# Patient Record
Sex: Female | Born: 1935 | Race: White | Hispanic: No
Health system: Southern US, Community
[De-identification: ages and names within clinical notes are randomized; demographics above are authoritative.]

---

## 2003-12-08 ENCOUNTER — Other Ambulatory Visit: Payer: Self-pay

## 2005-03-26 ENCOUNTER — Ambulatory Visit: Payer: Self-pay | Admitting: Cardiology

## 2005-05-17 ENCOUNTER — Ambulatory Visit: Payer: Self-pay

## 2005-05-24 ENCOUNTER — Ambulatory Visit: Payer: Self-pay | Admitting: Ophthalmology

## 2006-09-05 ENCOUNTER — Ambulatory Visit: Payer: Self-pay | Admitting: Ophthalmology

## 2006-09-12 ENCOUNTER — Ambulatory Visit: Payer: Self-pay | Admitting: Ophthalmology

## 2007-08-22 ENCOUNTER — Ambulatory Visit: Payer: Self-pay | Admitting: Family Medicine

## 2008-03-25 ENCOUNTER — Other Ambulatory Visit: Payer: Self-pay

## 2008-03-25 ENCOUNTER — Emergency Department: Payer: Self-pay | Admitting: Emergency Medicine

## 2008-09-13 ENCOUNTER — Ambulatory Visit: Payer: Self-pay | Admitting: Cardiology

## 2008-12-12 ENCOUNTER — Ambulatory Visit: Payer: Self-pay | Admitting: Internal Medicine

## 2008-12-15 ENCOUNTER — Emergency Department: Payer: Self-pay | Admitting: Emergency Medicine

## 2009-01-15 ENCOUNTER — Ambulatory Visit: Payer: Self-pay | Admitting: Internal Medicine

## 2009-02-12 ENCOUNTER — Ambulatory Visit: Payer: Self-pay | Admitting: Vascular Surgery

## 2009-09-18 ENCOUNTER — Emergency Department: Payer: Self-pay | Admitting: Emergency Medicine

## 2009-12-06 IMAGING — XA IR VASCULAR PROCEDURE
7 series · 14 of 14 positions shown · IV contrast (IODINE)
Comparison: none

[Series 1: aortagram · 2 of 2 slices shown (1 of 7)]
[im 1/2]
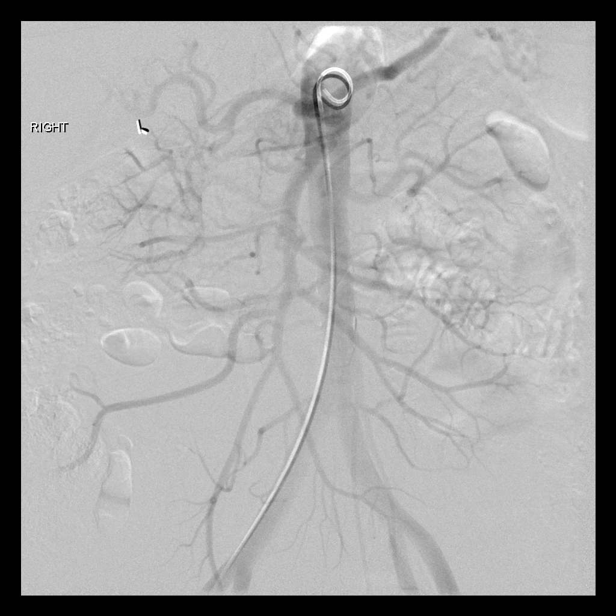
[im 2/2]
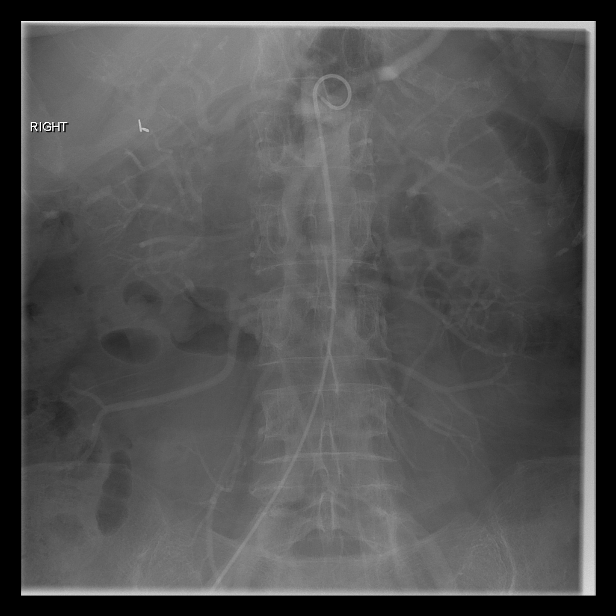

[Series 2: aortagram · 2 of 2 slices shown (2 of 7)]
[im 1/2]
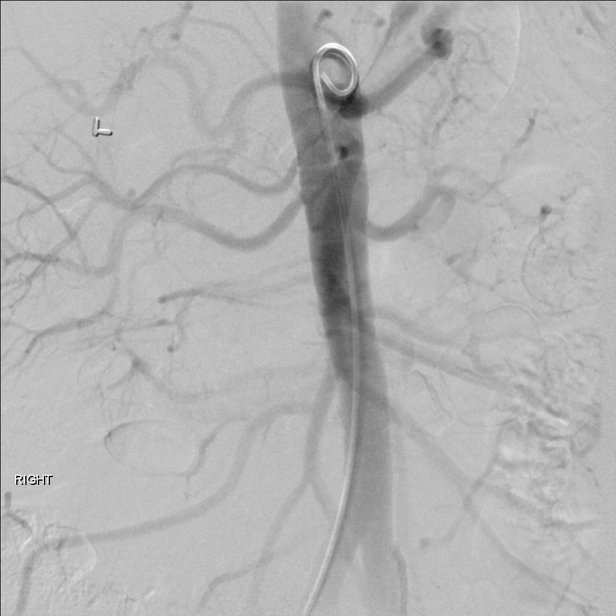
[im 2/2]
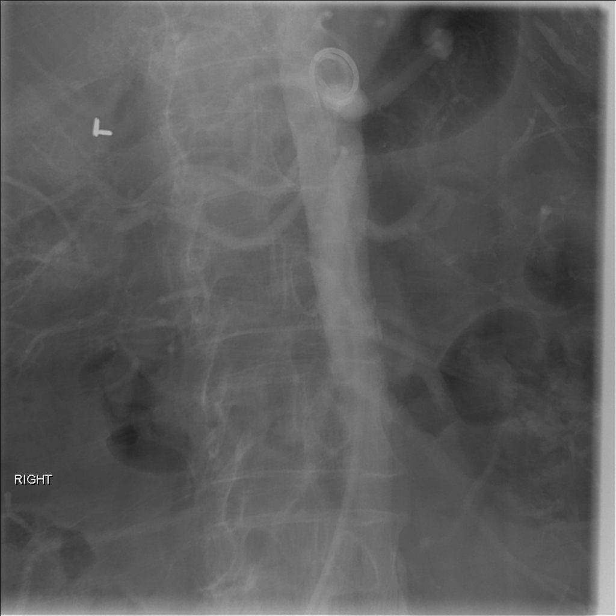

[Series 3: aortagram · 2 of 2 slices shown (3 of 7)]
[im 1/2]
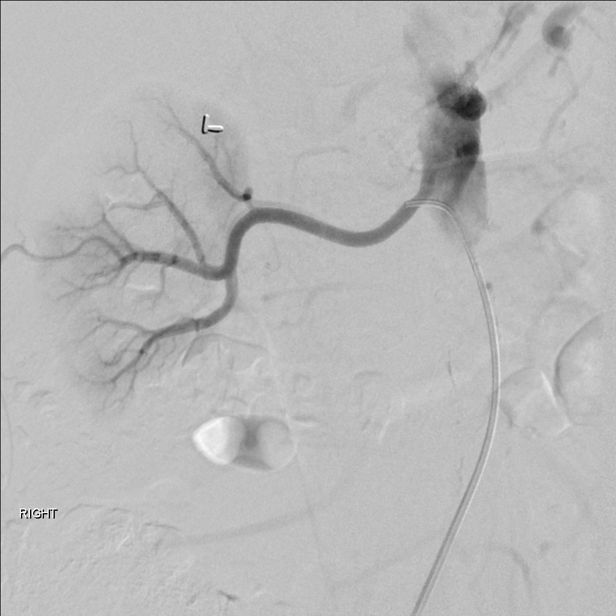
[im 2/2]
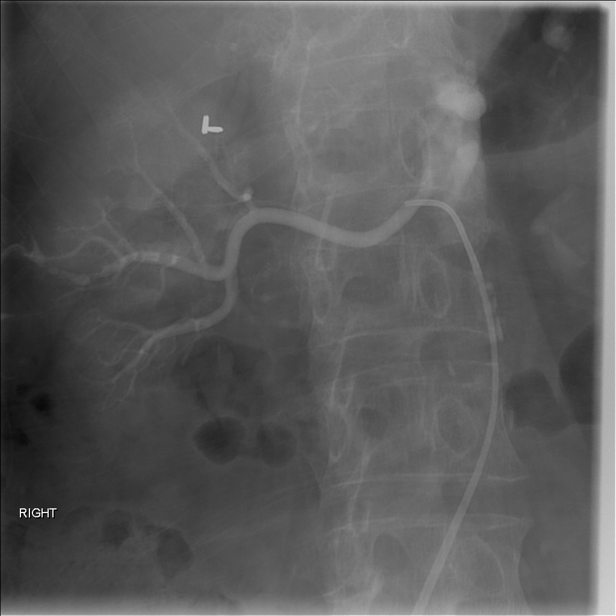

[Series 4: aortagram · 2 of 2 slices shown (4 of 7)]
[im 1/2]
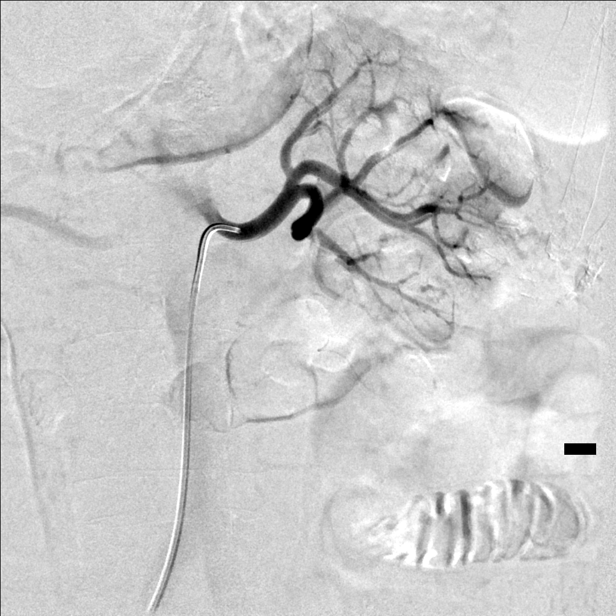
[im 2/2]
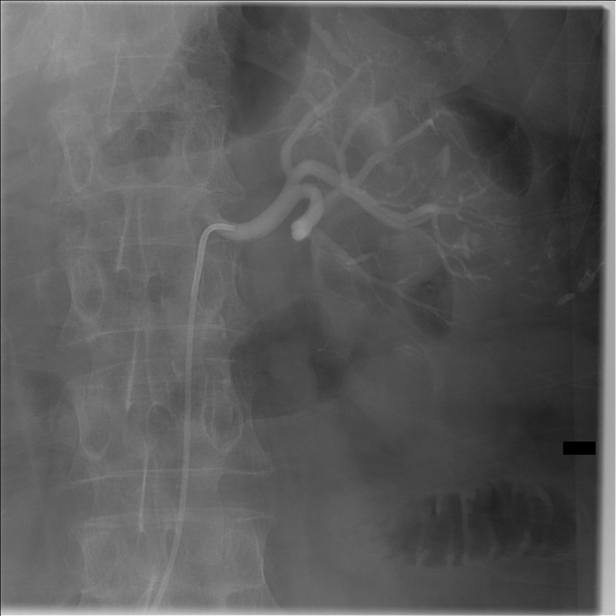

[Series 5: aortagram · 2 of 2 slices shown (5 of 7)]
[im 1/2]
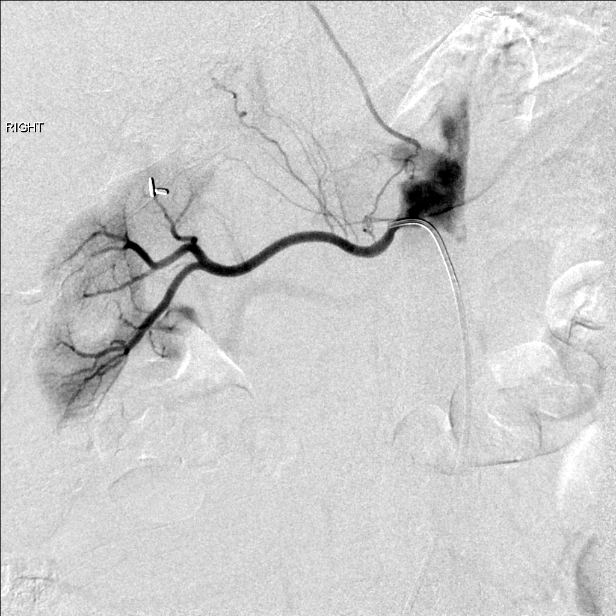
[im 2/2]
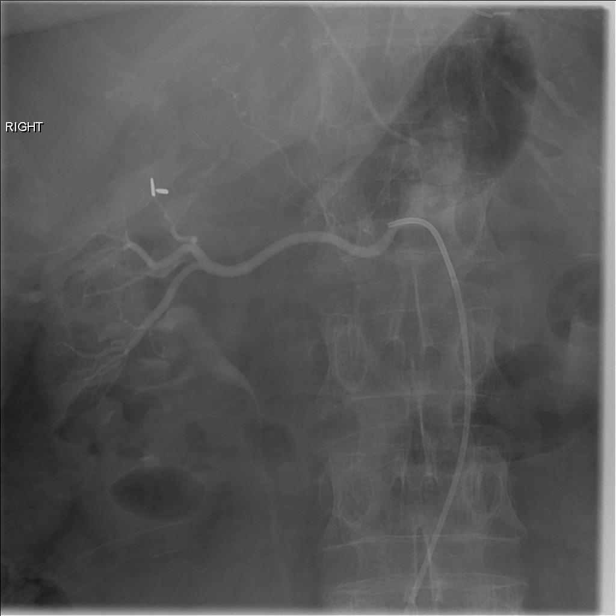

[Series 6: aortagram · 2 of 2 slices shown (6 of 7)]
[im 1/2]
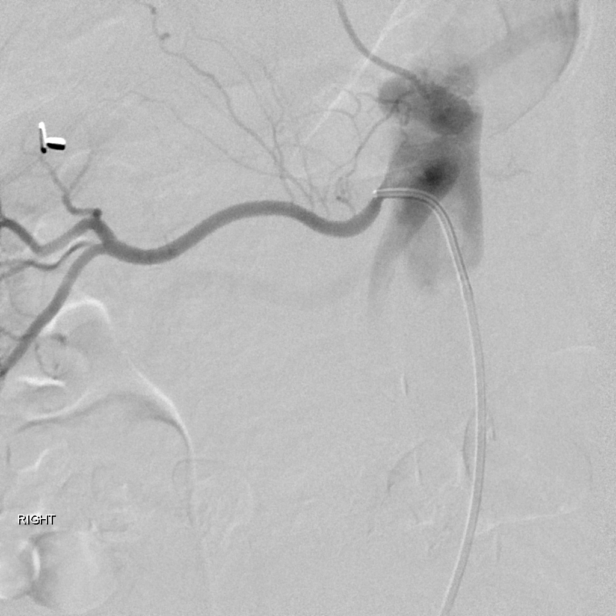
[im 2/2]
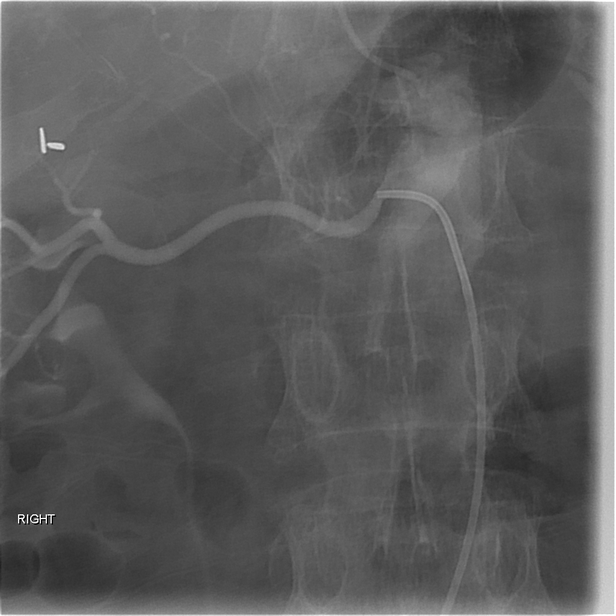

[Series 7: aortagram · 2 of 2 slices shown (7 of 7)]
[im 1/2]
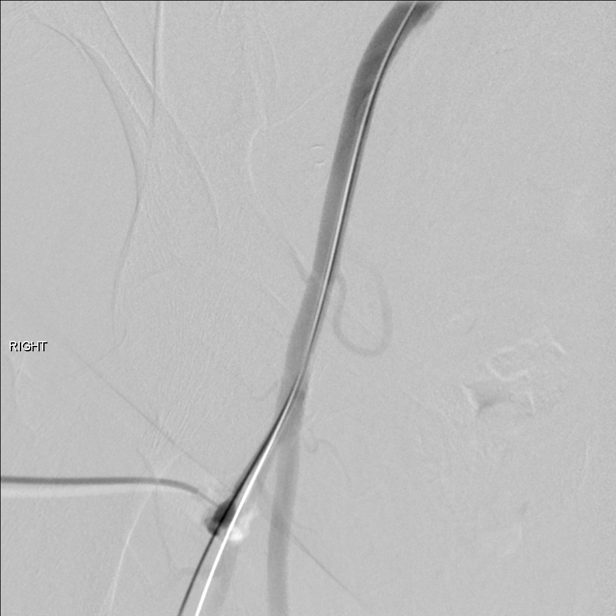
[im 2/2]
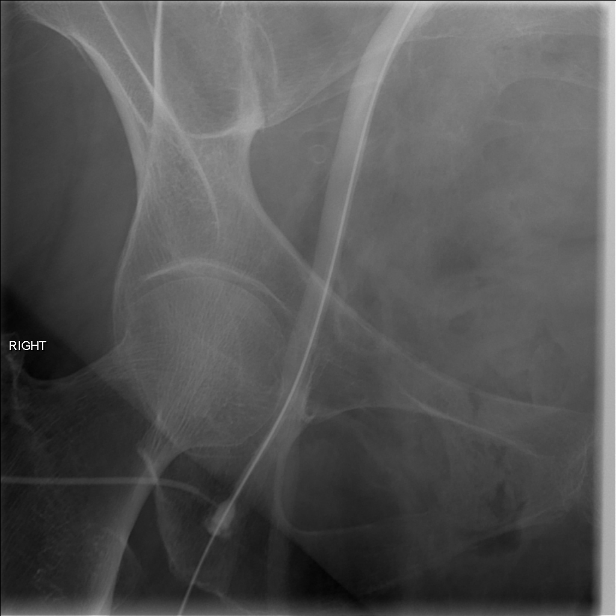

[14 of 14 positions shown; findings below may reference images not displayed]

IMAGES IMPORTED FROM THE [employer] WORKFLOW SYSTEM
NO DICTATION FOR STUDY

## 2010-08-18 ENCOUNTER — Inpatient Hospital Stay: Payer: Self-pay | Admitting: Internal Medicine

## 2011-01-18 ENCOUNTER — Inpatient Hospital Stay: Payer: Self-pay | Admitting: Internal Medicine

## 2011-06-06 ENCOUNTER — Emergency Department: Payer: Self-pay | Admitting: Emergency Medicine

## 2011-06-14 ENCOUNTER — Ambulatory Visit: Payer: Self-pay | Admitting: Cardiology

## 2011-06-17 ENCOUNTER — Ambulatory Visit: Payer: Self-pay | Admitting: Cardiology

## 2012-04-28 ENCOUNTER — Emergency Department: Payer: Self-pay | Admitting: Emergency Medicine

## 2012-04-28 LAB — COMPREHENSIVE METABOLIC PANEL
Albumin: 3.6 g/dL (ref 3.4–5.0)
Alkaline Phosphatase: 86 U/L (ref 50–136)
Anion Gap: 9 (ref 7–16)
BUN: 22 mg/dL — ABNORMAL HIGH (ref 7–18)
Bilirubin,Total: 0.3 mg/dL (ref 0.2–1.0)
Calcium, Total: 9 mg/dL (ref 8.5–10.1)
Chloride: 108 mmol/L — ABNORMAL HIGH (ref 98–107)
Co2: 24 mmol/L (ref 21–32)
Creatinine: 1.37 mg/dL — ABNORMAL HIGH (ref 0.60–1.30)
EGFR (African American): 42 — ABNORMAL LOW
EGFR (Non-African Amer.): 36 — ABNORMAL LOW
Glucose: 132 mg/dL — ABNORMAL HIGH (ref 65–99)
Osmolality: 286 (ref 275–301)
Potassium: 3.7 mmol/L (ref 3.5–5.1)
SGOT(AST): 17 U/L (ref 15–37)
SGPT (ALT): 15 U/L (ref 12–78)
Sodium: 141 mmol/L (ref 136–145)
Total Protein: 7.2 g/dL (ref 6.4–8.2)

## 2012-04-28 LAB — CBC
HCT: 37.5 % (ref 35.0–47.0)
HGB: 13 g/dL (ref 12.0–16.0)
MCH: 32 pg (ref 26.0–34.0)
MCHC: 34.6 g/dL (ref 32.0–36.0)
MCV: 93 fL (ref 80–100)
Platelet: 190 10*3/uL (ref 150–440)
RBC: 4.05 10*6/uL (ref 3.80–5.20)
RDW: 13.1 % (ref 11.5–14.5)
WBC: 6 10*3/uL (ref 3.6–11.0)

## 2012-04-28 LAB — TROPONIN I: Troponin-I: <0.02 ng/mL

## 2013-03-14 ENCOUNTER — Ambulatory Visit: Payer: Self-pay | Admitting: Internal Medicine

## 2013-03-19 ENCOUNTER — Ambulatory Visit: Payer: Self-pay | Admitting: Orthopedic Surgery

## 2013-03-19 DIAGNOSIS — Z01810 Encounter for preprocedural cardiovascular examination: Secondary | ICD-10-CM

## 2013-03-19 LAB — BASIC METABOLIC PANEL
Anion Gap: 6 — ABNORMAL LOW (ref 7–16)
BUN: 15 mg/dL (ref 7–18)
Calcium, Total: 9.5 mg/dL (ref 8.5–10.1)
Chloride: 102 mmol/L (ref 98–107)
Co2: 28 mmol/L (ref 21–32)
Creatinine: 0.82 mg/dL (ref 0.60–1.30)
EGFR (African American): >60
EGFR (Non-African Amer.): >60
Glucose: 118 mg/dL — ABNORMAL HIGH (ref 65–99)
Osmolality: 274 (ref 275–301)
Potassium: 3.9 mmol/L (ref 3.5–5.1)
Sodium: 136 mmol/L (ref 136–145)

## 2013-03-19 LAB — HEMOGLOBIN: HGB: 13.3 g/dL (ref 12.0–16.0)

## 2013-03-19 LAB — HEMATOCRIT: HCT: 40.3 % (ref 35.0–47.0)

## 2013-03-21 ENCOUNTER — Emergency Department: Payer: Self-pay | Admitting: Emergency Medicine

## 2013-03-21 LAB — COMPREHENSIVE METABOLIC PANEL
Albumin: 2.7 g/dL — ABNORMAL LOW (ref 3.4–5.0)
Alkaline Phosphatase: 220 U/L — ABNORMAL HIGH (ref 50–136)
Anion Gap: 7 (ref 7–16)
BUN: 20 mg/dL — ABNORMAL HIGH (ref 7–18)
Bilirubin,Total: 0.5 mg/dL (ref 0.2–1.0)
Calcium, Total: 9.1 mg/dL (ref 8.5–10.1)
Chloride: 102 mmol/L (ref 98–107)
Co2: 29 mmol/L (ref 21–32)
Creatinine: 0.89 mg/dL (ref 0.60–1.30)
EGFR (African American): >60
EGFR (Non-African Amer.): >60
Glucose: 110 mg/dL — ABNORMAL HIGH (ref 65–99)
Osmolality: 279 (ref 275–301)
Potassium: 4.2 mmol/L (ref 3.5–5.1)
SGOT(AST): 57 U/L — ABNORMAL HIGH (ref 15–37)
SGPT (ALT): 66 U/L (ref 12–78)
Sodium: 138 mmol/L (ref 136–145)
Total Protein: 6.2 g/dL — ABNORMAL LOW (ref 6.4–8.2)

## 2013-03-21 LAB — CBC
HCT: 37.1 % (ref 35.0–47.0)
HGB: 12.8 g/dL (ref 12.0–16.0)
MCH: 31.5 pg (ref 26.0–34.0)
MCHC: 34.6 g/dL (ref 32.0–36.0)
MCV: 91 fL (ref 80–100)
Platelet: 284 10*3/uL (ref 150–440)
RBC: 4.08 10*6/uL (ref 3.80–5.20)
RDW: 13.2 % (ref 11.5–14.5)
WBC: 6.1 10*3/uL (ref 3.6–11.0)

## 2013-03-21 LAB — URINALYSIS, COMPLETE
Glucose,UR: NEGATIVE mg/dL (ref 0–75)
Ketone: NEGATIVE
Nitrite: NEGATIVE
Ph: 5 (ref 4.5–8.0)
Protein: 30
RBC,UR: 3 /[HPF] (ref 0–5)
Specific Gravity: 1.026 (ref 1.003–1.030)
Squamous Epithelial: 1
WBC UR: 52 /[HPF] (ref 0–5)

## 2013-03-21 LAB — PROTIME-INR
INR: 2.2
Prothrombin Time: 24.1 s — ABNORMAL HIGH (ref 11.5–14.7)

## 2013-03-21 LAB — TROPONIN I: Troponin-I: <0.02 ng/mL

## 2013-03-23 LAB — URINE CULTURE

## 2013-03-27 ENCOUNTER — Inpatient Hospital Stay: Payer: Self-pay | Admitting: Cardiology

## 2013-03-27 LAB — BASIC METABOLIC PANEL
Anion Gap: 5 — ABNORMAL LOW (ref 7–16)
BUN: 18 mg/dL (ref 7–18)
Calcium, Total: 7.8 mg/dL — ABNORMAL LOW (ref 8.5–10.1)
Chloride: 107 mmol/L (ref 98–107)
Co2: 27 mmol/L (ref 21–32)
Creatinine: 0.85 mg/dL (ref 0.60–1.30)
EGFR (African American): >60
EGFR (Non-African Amer.): >60
Glucose: 115 mg/dL — ABNORMAL HIGH (ref 65–99)
Osmolality: 280 (ref 275–301)
Potassium: 3.4 mmol/L — ABNORMAL LOW (ref 3.5–5.1)
Sodium: 139 mmol/L (ref 136–145)

## 2013-03-27 LAB — CBC WITH DIFFERENTIAL/PLATELET
Basophil #: 0 10*3/uL (ref 0.0–0.1)
Basophil %: 0.7 %
Eosinophil #: 0.3 10*3/uL (ref 0.0–0.7)
Eosinophil %: 5.9 %
HCT: 33.5 % — ABNORMAL LOW (ref 35.0–47.0)
HGB: 11.4 g/dL — ABNORMAL LOW (ref 12.0–16.0)
Lymphocyte %: 10.9 %
Lymphs Abs: 0.6 10*3/uL — ABNORMAL LOW (ref 1.0–3.6)
MCH: 30.9 pg (ref 26.0–34.0)
MCHC: 33.9 g/dL (ref 32.0–36.0)
MCV: 91 fL (ref 80–100)
Monocyte #: 0.4 x10 3/mm (ref 0.2–0.9)
Monocyte %: 7 %
Neutrophil #: 4.2 10*3/uL (ref 1.4–6.5)
Neutrophil %: 75.5 %
Platelet: 244 10*3/uL (ref 150–440)
RBC: 3.68 10*6/uL — ABNORMAL LOW (ref 3.80–5.20)
RDW: 12.9 % (ref 11.5–14.5)
WBC: 5.6 10*3/uL (ref 3.6–11.0)

## 2013-03-27 LAB — PROTIME-INR
INR: 0.9
Prothrombin Time: 12.6 s (ref 11.5–14.7)

## 2013-03-28 LAB — PATHOLOGY REPORT

## 2013-03-29 ENCOUNTER — Inpatient Hospital Stay: Payer: Self-pay | Admitting: Internal Medicine

## 2013-03-29 LAB — CBC
HCT: 33.5 % — ABNORMAL LOW (ref 35.0–47.0)
HGB: 11.4 g/dL — ABNORMAL LOW (ref 12.0–16.0)
MCH: 31 pg (ref 26.0–34.0)
MCHC: 33.9 g/dL (ref 32.0–36.0)
MCV: 91 fL (ref 80–100)
Platelet: 221 10*3/uL (ref 150–440)
RBC: 3.67 10*6/uL — ABNORMAL LOW (ref 3.80–5.20)
RDW: 13.1 % (ref 11.5–14.5)
WBC: 6.3 10*3/uL (ref 3.6–11.0)

## 2013-03-29 LAB — COMPREHENSIVE METABOLIC PANEL
Albumin: 2.2 g/dL — ABNORMAL LOW (ref 3.4–5.0)
Alkaline Phosphatase: 137 U/L — ABNORMAL HIGH (ref 50–136)
Anion Gap: 5 — ABNORMAL LOW (ref 7–16)
BUN: 22 mg/dL — ABNORMAL HIGH (ref 7–18)
Bilirubin,Total: 0.4 mg/dL (ref 0.2–1.0)
Calcium, Total: 8.5 mg/dL (ref 8.5–10.1)
Chloride: 105 mmol/L (ref 98–107)
Co2: 27 mmol/L (ref 21–32)
Creatinine: 0.98 mg/dL (ref 0.60–1.30)
EGFR (African American): >60
EGFR (Non-African Amer.): 54 — ABNORMAL LOW
Glucose: 145 mg/dL — ABNORMAL HIGH (ref 65–99)
Osmolality: 280 (ref 275–301)
Potassium: 4 mmol/L (ref 3.5–5.1)
SGOT(AST): 24 U/L (ref 15–37)
SGPT (ALT): 23 U/L (ref 12–78)
Sodium: 137 mmol/L (ref 136–145)
Total Protein: 5.5 g/dL — ABNORMAL LOW (ref 6.4–8.2)

## 2013-03-29 LAB — CK TOTAL AND CKMB (NOT AT ARMC)
CK, Total: 46 U/L (ref 21–215)
CK-MB: <0.5 ng/mL — ABNORMAL LOW (ref 0.5–3.6)

## 2013-03-29 LAB — PROTIME-INR
INR: 1
Prothrombin Time: 13.3 s (ref 11.5–14.7)

## 2013-03-29 LAB — TROPONIN I: Troponin-I: <0.02 ng/mL

## 2013-03-30 LAB — URINALYSIS, COMPLETE
Bilirubin,UR: NEGATIVE
Blood: NEGATIVE
Glucose,UR: NEGATIVE mg/dL (ref 0–75)
Ketone: NEGATIVE
Nitrite: NEGATIVE
Ph: 6 (ref 4.5–8.0)
Protein: NEGATIVE
RBC,UR: 2 /[HPF] (ref 0–5)
Specific Gravity: 1.005 (ref 1.003–1.030)
Squamous Epithelial: 4
WBC UR: 13 /[HPF] (ref 0–5)

## 2013-03-30 LAB — CBC WITH DIFFERENTIAL/PLATELET
Basophil #: 0 10*3/uL (ref 0.0–0.1)
Basophil %: 0.5 %
Eosinophil #: 0.6 10*3/uL (ref 0.0–0.7)
Eosinophil %: 12.6 %
HCT: 33.4 % — ABNORMAL LOW (ref 35.0–47.0)
HGB: 11.4 g/dL — ABNORMAL LOW (ref 12.0–16.0)
Lymphocyte %: 17.1 %
Lymphs Abs: 0.8 10*3/uL — ABNORMAL LOW (ref 1.0–3.6)
MCH: 31.1 pg (ref 26.0–34.0)
MCHC: 34.3 g/dL (ref 32.0–36.0)
MCV: 91 fL (ref 80–100)
Monocyte #: 0.3 x10 3/mm (ref 0.2–0.9)
Monocyte %: 6.9 %
Neutrophil #: 3.1 10*3/uL (ref 1.4–6.5)
Neutrophil %: 62.9 %
Platelet: 216 10*3/uL (ref 150–440)
RBC: 3.67 10*6/uL — ABNORMAL LOW (ref 3.80–5.20)
RDW: 13.1 % (ref 11.5–14.5)
WBC: 4.9 10*3/uL (ref 3.6–11.0)

## 2013-03-30 LAB — BASIC METABOLIC PANEL
Anion Gap: 8 (ref 7–16)
BUN: 23 mg/dL — ABNORMAL HIGH (ref 7–18)
Calcium, Total: 8.7 mg/dL (ref 8.5–10.1)
Chloride: 103 mmol/L (ref 98–107)
Co2: 26 mmol/L (ref 21–32)
Creatinine: 1 mg/dL (ref 0.60–1.30)
EGFR (African American): >60
EGFR (Non-African Amer.): 52 — ABNORMAL LOW
Glucose: 109 mg/dL — ABNORMAL HIGH (ref 65–99)
Osmolality: 278 (ref 275–301)
Potassium: 3.6 mmol/L (ref 3.5–5.1)
Sodium: 137 mmol/L (ref 136–145)

## 2013-03-30 LAB — PROTIME-INR
INR: 1.1
Prothrombin Time: 14.2 s (ref 11.5–14.7)

## 2013-03-30 LAB — LIPID PANEL
Cholesterol: 123 mg/dL (ref 0–200)
HDL Cholesterol: 40 mg/dL (ref 40–60)
Ldl Cholesterol, Calc: 61 mg/dL (ref 0–100)
Triglycerides: 108 mg/dL (ref 0–200)
VLDL Cholesterol, Calc: 22 mg/dL (ref 5–40)

## 2013-03-30 LAB — TSH: Thyroid Stimulating Horm: 5.57 u[IU]/mL — ABNORMAL HIGH

## 2013-03-31 LAB — CBC WITH DIFFERENTIAL/PLATELET
Basophil #: 0 10*3/uL (ref 0.0–0.1)
Basophil %: 0.7 %
Eosinophil #: 0.6 10*3/uL (ref 0.0–0.7)
Eosinophil %: 10.1 %
HCT: 31.9 % — ABNORMAL LOW (ref 35.0–47.0)
HGB: 11.1 g/dL — ABNORMAL LOW (ref 12.0–16.0)
Lymphocyte %: 13.4 %
Lymphs Abs: 0.8 10*3/uL — ABNORMAL LOW (ref 1.0–3.6)
MCH: 31.5 pg (ref 26.0–34.0)
MCHC: 34.8 g/dL (ref 32.0–36.0)
MCV: 90 fL (ref 80–100)
Monocyte #: 0.5 x10 3/mm (ref 0.2–0.9)
Monocyte %: 7.4 %
Neutrophil #: 4.2 10*3/uL (ref 1.4–6.5)
Neutrophil %: 68.4 %
Platelet: 228 10*3/uL (ref 150–440)
RBC: 3.53 10*6/uL — ABNORMAL LOW (ref 3.80–5.20)
RDW: 13.1 % (ref 11.5–14.5)
WBC: 6.2 10*3/uL (ref 3.6–11.0)

## 2013-03-31 LAB — BASIC METABOLIC PANEL
Anion Gap: 8 (ref 7–16)
BUN: 18 mg/dL (ref 7–18)
Calcium, Total: 7.9 mg/dL — ABNORMAL LOW (ref 8.5–10.1)
Chloride: 108 mmol/L — ABNORMAL HIGH (ref 98–107)
Co2: 24 mmol/L (ref 21–32)
Creatinine: 0.83 mg/dL (ref 0.60–1.30)
EGFR (African American): >60
EGFR (Non-African Amer.): >60
Glucose: 102 mg/dL — ABNORMAL HIGH (ref 65–99)
Osmolality: 281 (ref 275–301)
Potassium: 3.2 mmol/L — ABNORMAL LOW (ref 3.5–5.1)
Sodium: 140 mmol/L (ref 136–145)

## 2013-03-31 LAB — PROTIME-INR
INR: 1.9
Prothrombin Time: 21.3 s — ABNORMAL HIGH (ref 11.5–14.7)

## 2013-04-01 LAB — CBC WITH DIFFERENTIAL/PLATELET
Basophil #: 0 10*3/uL (ref 0.0–0.1)
Basophil %: 0.7 %
Eosinophil #: 0.6 10*3/uL (ref 0.0–0.7)
Eosinophil %: 11.2 %
HCT: 32.7 % — ABNORMAL LOW (ref 35.0–47.0)
HGB: 11.2 g/dL — ABNORMAL LOW (ref 12.0–16.0)
Lymphocyte %: 20.9 %
Lymphs Abs: 1.1 10*3/uL (ref 1.0–3.6)
MCH: 31.1 pg (ref 26.0–34.0)
MCHC: 34.3 g/dL (ref 32.0–36.0)
MCV: 91 fL (ref 80–100)
Monocyte #: 0.4 x10 3/mm (ref 0.2–0.9)
Monocyte %: 7.4 %
Neutrophil #: 3.1 10*3/uL (ref 1.4–6.5)
Neutrophil %: 59.8 %
Platelet: 216 10*3/uL (ref 150–440)
RBC: 3.6 10*6/uL — ABNORMAL LOW (ref 3.80–5.20)
RDW: 13.2 % (ref 11.5–14.5)
WBC: 5.2 10*3/uL (ref 3.6–11.0)

## 2013-04-01 LAB — BASIC METABOLIC PANEL
Anion Gap: 9 (ref 7–16)
BUN: 15 mg/dL (ref 7–18)
Calcium, Total: 8.2 mg/dL — ABNORMAL LOW (ref 8.5–10.1)
Chloride: 106 mmol/L (ref 98–107)
Co2: 25 mmol/L (ref 21–32)
Creatinine: 0.88 mg/dL (ref 0.60–1.30)
EGFR (African American): >60
EGFR (Non-African Amer.): >60
Glucose: 78 mg/dL (ref 65–99)
Osmolality: 279 (ref 275–301)
Potassium: 3.4 mmol/L — ABNORMAL LOW (ref 3.5–5.1)
Sodium: 140 mmol/L (ref 136–145)

## 2013-04-01 LAB — PROTIME-INR
INR: 2.1
Prothrombin Time: 23.2 s — ABNORMAL HIGH (ref 11.5–14.7)

## 2013-04-01 LAB — MAGNESIUM: Magnesium: 1.1 mg/dL — ABNORMAL LOW

## 2013-04-02 LAB — BASIC METABOLIC PANEL
Anion Gap: 6 — ABNORMAL LOW (ref 7–16)
BUN: 11 mg/dL (ref 7–18)
Calcium, Total: 8.1 mg/dL — ABNORMAL LOW (ref 8.5–10.1)
Chloride: 106 mmol/L (ref 98–107)
Co2: 26 mmol/L (ref 21–32)
Creatinine: 0.66 mg/dL (ref 0.60–1.30)
EGFR (African American): >60
EGFR (Non-African Amer.): >60
Glucose: 85 mg/dL (ref 65–99)
Osmolality: 274 (ref 275–301)
Potassium: 3.5 mmol/L (ref 3.5–5.1)
Sodium: 138 mmol/L (ref 136–145)

## 2013-04-02 LAB — CBC WITH DIFFERENTIAL/PLATELET
Basophil #: 0 10*3/uL (ref 0.0–0.1)
Basophil %: 0.7 %
Eosinophil #: 0.5 10*3/uL (ref 0.0–0.7)
Eosinophil %: 10.2 %
HCT: 31.3 % — ABNORMAL LOW (ref 35.0–47.0)
HGB: 10.7 g/dL — ABNORMAL LOW (ref 12.0–16.0)
Lymphocyte %: 18.3 %
Lymphs Abs: 0.9 10*3/uL — ABNORMAL LOW (ref 1.0–3.6)
MCH: 30.9 pg (ref 26.0–34.0)
MCHC: 34.2 g/dL (ref 32.0–36.0)
MCV: 90 fL (ref 80–100)
Monocyte #: 0.4 x10 3/mm (ref 0.2–0.9)
Monocyte %: 7.7 %
Neutrophil #: 3.1 10*3/uL (ref 1.4–6.5)
Neutrophil %: 63.1 %
Platelet: 209 10*3/uL (ref 150–440)
RBC: 3.47 10*6/uL — ABNORMAL LOW (ref 3.80–5.20)
RDW: 13.4 % (ref 11.5–14.5)
WBC: 4.9 10*3/uL (ref 3.6–11.0)

## 2013-04-02 LAB — PROTIME-INR
INR: 2.3
Prothrombin Time: 24.5 s — ABNORMAL HIGH (ref 11.5–14.7)

## 2013-04-02 LAB — MAGNESIUM: Magnesium: 1.8 mg/dL

## 2013-07-22 ENCOUNTER — Ambulatory Visit: Payer: Self-pay | Admitting: Neurology

## 2013-07-22 LAB — CBC WITH DIFFERENTIAL/PLATELET
Basophil #: 0.1 10*3/uL (ref 0.0–0.1)
Basophil %: 0.9 %
Eosinophil #: 0.1 10*3/uL (ref 0.0–0.7)
Eosinophil %: 2.1 %
HCT: 38.2 % (ref 35.0–47.0)
HGB: 12.7 g/dL (ref 12.0–16.0)
Lymphocyte %: 24.8 %
Lymphs Abs: 1.5 10*3/uL (ref 1.0–3.6)
MCH: 29.9 pg (ref 26.0–34.0)
MCHC: 33.3 g/dL (ref 32.0–36.0)
MCV: 90 fL (ref 80–100)
Monocyte #: 0.4 x10 3/mm (ref 0.2–0.9)
Monocyte %: 6.1 %
Neutrophil #: 4 10*3/uL (ref 1.4–6.5)
Neutrophil %: 66.1 %
Platelet: 189 10*3/uL (ref 150–440)
RBC: 4.26 10*6/uL (ref 3.80–5.20)
RDW: 13.5 % (ref 11.5–14.5)
WBC: 6.1 10*3/uL (ref 3.6–11.0)

## 2013-07-22 LAB — URINALYSIS, COMPLETE
Bilirubin,UR: NEGATIVE
Blood: NEGATIVE
Glucose,UR: NEGATIVE mg/dL (ref 0–75)
Ketone: NEGATIVE
Leukocyte Esterase: NEGATIVE
Nitrite: NEGATIVE
Ph: 6 (ref 4.5–8.0)
Protein: 30
RBC,UR: 11 /[HPF] (ref 0–5)
Specific Gravity: 1.025 (ref 1.003–1.030)
Squamous Epithelial: 2
WBC UR: 5 /[HPF] (ref 0–5)

## 2013-07-22 LAB — PROTIME-INR
INR: 1.3
Prothrombin Time: 16.6 s — ABNORMAL HIGH (ref 11.5–14.7)

## 2013-07-22 LAB — COMPREHENSIVE METABOLIC PANEL
Albumin: 3.2 g/dL — ABNORMAL LOW (ref 3.4–5.0)
Alkaline Phosphatase: 68 U/L
Anion Gap: 8 (ref 7–16)
BUN: 16 mg/dL (ref 7–18)
Bilirubin,Total: 0.4 mg/dL (ref 0.2–1.0)
Calcium, Total: 8.8 mg/dL (ref 8.5–10.1)
Chloride: 108 mmol/L — ABNORMAL HIGH (ref 98–107)
Co2: 24 mmol/L (ref 21–32)
Creatinine: 1.06 mg/dL (ref 0.60–1.30)
EGFR (African American): 56 — ABNORMAL LOW
EGFR (Non-African Amer.): 49 — ABNORMAL LOW
Glucose: 131 mg/dL — ABNORMAL HIGH (ref 65–99)
Osmolality: 282 (ref 275–301)
Potassium: 4 mmol/L (ref 3.5–5.1)
SGOT(AST): 23 U/L (ref 15–37)
SGPT (ALT): 12 U/L (ref 12–78)
Sodium: 140 mmol/L (ref 136–145)
Total Protein: 6.4 g/dL (ref 6.4–8.2)

## 2013-07-22 LAB — TROPONIN I: Troponin-I: <0.02 ng/mL

## 2013-07-22 LAB — APTT: Activated PTT: 33.8 s (ref 23.6–35.9)

## 2013-07-23 ENCOUNTER — Inpatient Hospital Stay: Payer: Self-pay | Admitting: Internal Medicine

## 2013-07-23 LAB — LIPID PANEL
Cholesterol: 125 mg/dL (ref 0–200)
HDL Cholesterol: 43 mg/dL (ref 40–60)
Ldl Cholesterol, Calc: 61 mg/dL (ref 0–100)
Triglycerides: 105 mg/dL (ref 0–200)
VLDL Cholesterol, Calc: 21 mg/dL (ref 5–40)

## 2013-07-24 LAB — PROTIME-INR
INR: 1.6
Prothrombin Time: 18.7 s — ABNORMAL HIGH (ref 11.5–14.7)

## 2013-07-25 LAB — PROTIME-INR
INR: 1.6
Prothrombin Time: 18.8 s — ABNORMAL HIGH (ref 11.5–14.7)

## 2013-07-26 LAB — PROTIME-INR
INR: 1.6
Prothrombin Time: 19 s — ABNORMAL HIGH (ref 11.5–14.7)

## 2013-07-26 LAB — BASIC METABOLIC PANEL
Anion Gap: 3 — ABNORMAL LOW (ref 7–16)
BUN: 6 mg/dL — ABNORMAL LOW (ref 7–18)
Calcium, Total: 8.4 mg/dL — ABNORMAL LOW (ref 8.5–10.1)
Chloride: 112 mmol/L — ABNORMAL HIGH (ref 98–107)
Co2: 29 mmol/L (ref 21–32)
Creatinine: 0.99 mg/dL (ref 0.60–1.30)
EGFR (African American): >60
EGFR (Non-African Amer.): 53 — ABNORMAL LOW
Glucose: 91 mg/dL (ref 65–99)
Osmolality: 284 (ref 275–301)
Potassium: 3.5 mmol/L (ref 3.5–5.1)
Sodium: 144 mmol/L (ref 136–145)

## 2013-07-26 LAB — PLATELET COUNT: Platelet: 159 10*3/uL (ref 150–440)

## 2013-07-26 LAB — HEMOGLOBIN: HGB: 11.5 g/dL — ABNORMAL LOW (ref 12.0–16.0)

## 2013-07-27 LAB — PROTIME-INR
INR: 1.7
Prothrombin Time: 20.1 s — ABNORMAL HIGH (ref 11.5–14.7)

## 2013-11-13 ENCOUNTER — Emergency Department: Payer: Self-pay | Admitting: Emergency Medicine

## 2013-11-13 LAB — CBC WITH DIFFERENTIAL/PLATELET
Basophil #: 0 10*3/uL (ref 0.0–0.1)
Basophil %: 0.6 %
Eosinophil #: 0.3 10*3/uL (ref 0.0–0.7)
Eosinophil %: 6.3 %
HCT: 36.4 % (ref 35.0–47.0)
HGB: 11.8 g/dL — ABNORMAL LOW (ref 12.0–16.0)
Lymphocyte %: 18.9 %
Lymphs Abs: 1 10*3/uL (ref 1.0–3.6)
MCH: 29.8 pg (ref 26.0–34.0)
MCHC: 32.5 g/dL (ref 32.0–36.0)
MCV: 92 fL (ref 80–100)
Monocyte #: 0.3 x10 3/mm (ref 0.2–0.9)
Monocyte %: 5.2 %
Neutrophil #: 3.7 10*3/uL (ref 1.4–6.5)
Neutrophil %: 69 %
Platelet: 155 10*3/uL (ref 150–440)
RBC: 3.97 10*6/uL (ref 3.80–5.20)
RDW: 14.5 % (ref 11.5–14.5)
WBC: 5.3 10*3/uL (ref 3.6–11.0)

## 2013-11-13 LAB — COMPREHENSIVE METABOLIC PANEL
Albumin: 3 g/dL — ABNORMAL LOW (ref 3.4–5.0)
Alkaline Phosphatase: 82 U/L
Anion Gap: 4 — ABNORMAL LOW (ref 7–16)
BUN: 24 mg/dL — ABNORMAL HIGH (ref 7–18)
Bilirubin,Total: 0.2 mg/dL (ref 0.2–1.0)
Calcium, Total: 8.6 mg/dL (ref 8.5–10.1)
Chloride: 108 mmol/L — ABNORMAL HIGH (ref 98–107)
Co2: 27 mmol/L (ref 21–32)
Creatinine: 1.1 mg/dL (ref 0.60–1.30)
EGFR (African American): 54 — ABNORMAL LOW
EGFR (Non-African Amer.): 46 — ABNORMAL LOW
Glucose: 117 mg/dL — ABNORMAL HIGH (ref 65–99)
Osmolality: 283 (ref 275–301)
Potassium: 4.2 mmol/L (ref 3.5–5.1)
SGOT(AST): 17 U/L (ref 15–37)
SGPT (ALT): 12 U/L (ref 12–78)
Sodium: 139 mmol/L (ref 136–145)
Total Protein: 6.6 g/dL (ref 6.4–8.2)

## 2013-11-13 LAB — CK: CK, Total: 37 U/L

## 2014-03-08 ENCOUNTER — Emergency Department: Payer: Self-pay | Admitting: Emergency Medicine

## 2014-12-13 NOTE — Discharge Summary (Signed)
PATIENT NAME:  Sheri Hayden, Sheri Hayden MR#:  [id number] DATE OF BIRTH:  October 04, 1927  DATE OF ADMISSION:  03/29/2013 DATE OF DISCHARGE:  04/02/2013  DISCHARGE DIAGNOSES: 1.  Stroke.  2.  Hypotension.  3.  Atrial fibrillation.  4.  Back pain with recent kyphoplasty.   DISCHARGE MEDICATIONS: Per [facility] medication reconciliation done in the [facility] discharge form. Please see that for details.   HISTORY AND PHYSICAL: Please see H and P done on the day of admission for details.   HOSPITAL COURSE: The patient was admitted confused, left-sided weakness and hypotensive after recently having high-dose carvedilol and diltiazem started. She was given fluid resuscitation. Blood pressure came up some. She was off her meds. Her symptoms improved markedly. Strokes were seen in the right frontoparietal region, likely from her recent strokes at [facility]. She was back to her usual status, except for mildly more confused while in the hospital. She will be discharged on low-dose Toprol for rate control. She is fully anticoagulated again on her warfarin. She had been off for the kyphoplasty she had a few days prior to admission. INR was 2.3 this morning. Magnesium was normal at 1.8, had been low earlier in the hospitalization.  Met b was normal as well. Hemoglobin is stable at 10.7 this morning as well. She had a repeat carotid Doppler done with medically treatable carotid stenosis, already being on statin. Echocardiogram was also done, which showed no source of emboli, normal LV function. MRI was not done given her permanent pacemaker. The rest of her workup was unremarkable. Again, the head CT showed low attenuation areas in bilateral frontal and right temporoparietal regions suggestive of late subacute infarcts, which were likely the ones she had at [facility] last month. She will home health ordered. She will be seen in followup soon in my office.    TIME SPENT: It took approximately 35 minutes for all dc tasks today   ____________________________ Ocie Cornfield. Ouida Sills, MD mwa:dmm D: 04/02/2013 08:15:17 ET T: 04/02/2013 10:25:01 ET JOB#: [id number]  cc: Ocie Cornfield. Ouida Sills, MD, <Dictator> Kirk Ruths MD ELECTRONICALLY SIGNED 04/02/2013 18:34

## 2014-12-13 NOTE — Discharge Summary (Signed)
PATIENT NAME:  Sheri Hayden, Sheri Hayden MR#:  [id number] DATE OF BIRTH:  08-May-1928  PRIMARY CARE PHYSICIAN:  Dr. Dareen PianoAnderson.   FINAL DIAGNOSES: 1.  Atrial fibrillation and atrial flutter.  2.  Coronary artery disease.  3.  Hyperlipidemia.  4.  Hypertension.  5.  L1 compression fracture.   DISCHARGE MEDICATIONS: Warfarin 2 mg daily, diltiazem extended release 240 mg daily, carvedilol 12.5 mg b.i.d., simvastatin 10 mg daily, lisinopril 5 mg daily, omeprazole 20 mg every morning, phenytoin 300 mg at bedtime, mirtazapine 0.5 mg at bedtime, tramadol 50 mg q.4 p.r.n., Norco 1325/5; one q.4 hours. p.r.n.   PROCEDURES: L1 biopsy and kyphoplasty.   HISTORY OF PRESENT COURSE:  Please see cardiology consult dictated on 03/27/2013.   HOSPITAL COURSE:  The patient underwent L1 biopsy and kyphoplasty for L1 compression fracture. Following the procedure, the patient developed atrial fibrillation with a rapid ventricular rate. In the recovery room, the patient received diltiazem 50 mg intravenous bolus, followed by a 10 mg bolus, but remained in atrial fibrillation and atrial flutter with a rate of 130. The patient was admitted to telemetry where she remained clinically and hemodynamically stable. She was restarted on her home medications which included Cardizem CD 240 mg daily and carvedilol 12.5 mg b.i.d., which had been recently discontinued. The patient converted to sinus rhythm and was in normal sinus rhythm on the morning of 03/28/2013. The patient is discharged home in stable condition. She is scheduled to see Dr. Lady GaryFath in followup in 1 to 2 weeks.    ____________________________ Marcina MillardAlexander Bentzion Dauria, MD ap:NTS D: 03/28/2013 07:03:30 ET T: 03/28/2013 07:51:24 ET JOB#: [id number]  cc: Marcina MillardAlexander Tristian Bouska, MD, <Dictator> Marcina MillardALEXANDER Ivon Roedel MD ELECTRONICALLY SIGNED 04/18/2013 15:59

## 2014-12-13 NOTE — H&P (Signed)
PATIENT NAME:  Sheri Hayden, Sheri Hayden DATE OF BIRTH:  1928/03/06  DATE OF ADMISSION:  03/27/2013  PRIMARY CARE PHYSICIAN: Marya AmslerMarshall W. Dareen PianoAnderson, MD  CHIEF COMPLAINT: "I had surgery."   HISTORY OF PRESENT ILLNESS: The patient is an 81 year old female with known history of coronary artery disease status post bypass graft surgery. The patient has had a history of paroxysmal atrial fibrillation, followed by Dr. Lady GaryFath. The patient underwent kyphoplasty today. Following the procedure, the patient developed atrial fibrillation with a rapid ventricular rate of 150 BPM. The patient was asymptomatic, denied chest pain or palpitations. The patient was treated with Cardizem bolus 15 mg, followed by 10 mg and was restarted on Cardizem CD 240 mg daily. The patient was on Cardizem CD 240 mg and carvedilol 12.5 mg b.i.d. as an outpatient, which was discontinued recently. The patient has remained in atrial fibrillation/flutter with a rapid rate of 130 BPM. She is now admitted to observation to restart her atrial fibrillation medications.   PAST MEDICAL HISTORY:  1. Status post coronary artery bypass graft surgery 02/23/1996 with LIMA to LAD, saphenous vein bypass graft to right PDA and saphenous vein bypass graft to D1.  2. Paroxysmal atrial fibrillation.  3. Hyperlipidemia.  4. Hypertension.  5. Seizure disorder.  6. Dementia.   MEDICATIONS:  1. Warfarin 4 mg daily.  2. Lisinopril 5 mg daily. 3. Mirtazapine 7.5 mg at bedtime.  4. Omeprazole 20 mg q.a.m.  5. Phenytoin 300 mg at bedtime.  6. Simvastatin 10 mg at bedtime.  7. Tramadol 50 mg q.4 p.r.n.   SOCIAL HISTORY: The patient is married. She is retired. She has no history of tobacco abuse.   FAMILY HISTORY: Notable for 3 brothers all status post bypass graft surgery before the age of 79.   REVIEW OF SYSTEMS:  CONSTITUTIONAL: No fever or chills.  EYES: No blurry vision.  EARS: No hearing loss.  RESPIRATORY: No shortness of breath.   CARDIOVASCULAR: No chest pain.  GASTROINTESTINAL: No nausea, vomiting or diarrhea.  GENITOURINARY: No dysuria or hematuria.  ENDOCRINE: No polyuria or polydipsia.  MUSCULOSKELETAL: The patient just underwent kyphoplasty.  NEUROLOGICAL: No focal muscle weakness or numbness.  PSYCHOLOGICAL: No depression or anxiety.   PHYSICAL EXAMINATION:  VITAL SIGNS: Blood pressure was 90/60, pulse was 130 and regular.  HEENT: Pupils equally reactive to light and accommodation.  NECK: Supple without thyromegaly.  LUNGS: Clear.  CARDIOVASCULAR: With normal JVP. Normal PMI. Tachycardia. Normal S1, S2. No appreciable gallop, murmur or rub.  ABDOMEN: Soft and nontender.  PULSES: Intact bilaterally.  MUSCULOSKELETAL: Normal muscle tone.  NEUROLOGICAL: The patient is alert and oriented x3. Motor and sensory both grossly intact.   IMPRESSION: An 81 year old female who developed atrial fibrillation following kyphoplasty, likely triggered by recent discontinuation of both Cardizem and carvedilol.   RECOMMENDATIONS:  1. Admit to observation. 2. Restart Cardizem CD 240 mg daily.  3. Restart carvedilol 12.5 mg b.i.d.  4. Will continue to hold warfarin for now.   ____________________________ Marcina MillardAlexander Trevante Tennell, MD ap:OSi D: 03/27/2013 13:26:00 ET T: 03/27/2013 13:44:28 ET JOB#: [id number]  cc: Marcina MillardAlexander Aminah Zabawa, MD, <Dictator> Marcina MillardALEXANDER Demarrion Meiklejohn MD ELECTRONICALLY SIGNED 04/18/2013 15:59

## 2014-12-13 NOTE — Discharge Summary (Signed)
PATIENT NAME:  Sheri Hayden, Sheri Hayden MR#:  [id number] DATE OF BIRTH:  06-09-1928  DATE OF ADMISSION:  07/23/2013 DATE OF DISCHARGE: 07/26/2013   DISCHARGE DIAGNOSES:  1.  Seizure disorder with Todd'Hayden paralysis.  2.  Encephalopathy creating anxiety and tremulousness.  3.  Dementia worsening, already at home with hospice.  4.  Atrial fibrillation, on warfarin and rate control with metoprolol.   [facility]GE MEDICATIONS: [facility] Please see for details. The main difference in admission meds are increase in Keppra to 1000 mg b.i.d. from 500 b.i.d. and increasing her Seroquel to 50 mg at night, 25 the morning with the addition of Cymbalta and as needed Ativan.   HISTORY AND PHYSICAL: Please see detailed history and physical done on admission.   HOSPITAL COURSE: The patient was admitted more confused and combative with the worry of hemiparesis, facial droop and twitching. She could not have a MRI done, but sequential CT scans were done of her head, which showed no new strokes. She had no clear seizure activity, though again, her antiepileptics were increased. She was seen by neurology on admission to help with this plan on admission. They did not have other ideas at this point. There is no clear EEG evidence of seizures noted by neurology as well. At this point, she is much calmer, but is not ambulating well and is quite confused. Skilled nursing facility is being arranged. She will be discharged if this is finalized today.   Please note, it took her approximately 35 minutes to do all discharge tasks today.  ____________________________ Marya AmslerMarshall W. Dareen PianoAnderson, MD mwa:aw D: 07/26/2013 07:54:25 ET T: 07/26/2013 08:07:53 ET JOB#: [id number]  cc: Marya AmslerMarshall W. Dareen PianoAnderson, MD, <Dictator> Lauro RegulusMARSHALL W Jabril Pursell MD ELECTRONICALLY SIGNED 07/26/2013 15:30

## 2014-12-13 NOTE — Consult Note (Signed)
Referring Physician:  James Hayden, Sheri Hayden :   Primary Care Physician:  Sheri Hayden, [address], [place], [place] [address], [phone]  Reason for Consult: Admit Date: 22-Jul-2013  Chief Complaint: CVA  Reason for Consult: CVA   History of Present Illness: History of Present Illness:   81 year old woman with afib presentswith left upper and lower extremity weakness, left facial droop and dysarthria.  At the same time, per report the patients daughter also noticed that the patient was twitching on the left side of her face and also in the LUE.  In the ED, there was concern for acute stroke vs seizure.  Patient ultimately had HCT and head CTA performed which were unremarkable, no acute stroke seen.  Given concern for stroke with a post ictal Todds paralysis, patient was started on Keppra with a load and then transitioned to maintenance dosing of 500 mg bid.  She was recommended to have a repeat HCT to eval for stroke after more time has elapsed and this was also noted to be negative for acute stroke.  Patient says that during her hospitalization that her symptoms have improved a little bit on the left side.  Her son agrees with this, he says that when she came in she could barely move her LUE at all and now can move it well with good strength but there seems to be a lack of coordination which is new.  He feels that she is still a little bit confused as well with some comprehension deficits in particular.  Symptoms are constant.  Symptoms are of moderate severity.  Nothing makes them better or worse.  Has pacemaker so no MRI.  MEDICAL HISTORY: Coronary artery disease, status post CABG in 1997.  Paroxysmal atrial fibrillation.  Hyperlipidemia.  Hypertension.  Seizure disorder.  Dementia.  Permanent pacemaker.  Cerebrovascular accident in 2013, then in June 2014 and then recently another CVA. History of GERD.    SURGICAL HISTORY: Appendectomy. Cholecystectomy.  CABG in 1997.  Hysterectomy.   HISTORY: Coronary artery disease in three brothers.   SOCIAL HISTORY: Never smoked, never drank. She lives with her daughter. She has dementia. Now the patient has home hospice MEDICATIONS: Checked with the daughter: Metoprolol 25 mg once a day, Keppra 500 mg twice daily (her last dose was supposed to be today), prednisone 10 mg once a day for appetite stimulation, Seroquel 50 mg once a day at night, warfarin 4 mg once a day.   ALLERGIES: NITROFURANTOIN AND DILANTIN.         ROS:  General denies complaints    HEENT no complaints   Lungs no complaints   Cardiac no complaints   GI no complaints   GU no complaints   Musculoskeletal no complaints   Extremities no complaints   Skin no complaints   Endocrine no complaints   Psych no complaints   Past Medical/Surgical Hx:  Dementia: per chart notes  CVA/Stroke:   gerd:   silent MI:   Atrial fibrillation:   Hypercholesterolemia:   HTN:   appendectomy:   cholecystectomy:   gall bladder:   Hysterectomy - Total:   triple bypass:   Home Medications: Medication Instructions Last Modified Date/Time  levETIRAcetam 500 mg oral tablet 1 tab(s) orally every 12 hours 30-Nov-14 18:38  metoprolol succinate 25 mg oral tablet, extended release 1 tab(s) orally once a day 30-Nov-14 18:38  omeprazole 20 mg oral delayed release tablet 1 tab(s) orally once a day (in  the morning) 30-Nov-14 18:38  simvastatin 10 mg oral tablet 1 tab(s) orally once a day (at bedtime) 30-Nov-14 18:38  QUEtiapine 25 mg oral tablet 2 tab(s) orally once a day (at bedtime) 30-Nov-14 18:38  predniSONE 10 mg oral tablet 1 tab(s) orally once a day 30-Nov-14 18:38  warfarin 4 mg oral tablet 1 tab(s) orally once a day 30-Nov-14 18:38   [facility] Current Meds:  Sodium Chloride 0.9%, 1000 ml at 60 ml/hr  Aspirin Enteric Coated tablet, ( Ecotrin)  325 mg Oral daily  - Indication: Pain/Fever/Thromboembolic Disorders/Post  MI/Prophylaxis MI  Instructions:  Initiate Bleeding Precautions Protocol--DO NOT CRUSH  atorvaSTATin tablet, 20 mg Oral daily  - Indication: Hypercholesterolemia  Acetaminophen * tablet, ( Tylenol (325 mg) tablet)  650 mg Oral q4h PRN for pain or temp. greater than 100.4  - Indication: Pain/Fever  HePARin injection, 5000 unit(s), Subcutaneous, q12h  Indication: Anticoagulant, Monitor Anticoags per hospital protocol  Ondansetron injection, ( Zofran injection )  4 mg, IV push, q4h PRN for Nausea/Vomiting  Indication: Nausea/ Vomiting  levETIRAcetam injection,  ( Keppra injection )  1500 mg in Dextrose 5% 100 ml, IV Piggyback, q12h, Infuse over 15 minute(s), Mixed in 100 ml given over 15 minutes.  QUEtiapine tablet, 50 mg Oral at bedtime  - Indication: Management of signs/symptoms of psychotic disorders/Anxiety  Nursing Saline Flush, 3 to 6 ml, IV push, Q1M PRN for IV Maintenance  levETIRAcetam  tablet,  ( Keppra)  1000 mg Oral q12h  - Indication: Seizures, bipolar disorder  Warfarin tablet,  ( Coumadin)  3 mg Oral q5pm  - Indication: Anticoagulant, Monitor Anticoags per hospital protocol  Instructions:  [Waste Code: Black with pkg]  LORazepam tablet, ( Ativan)  0.5 mg Oral q6h PRN for anxiety  - Indication: Anxiety/ Seizure/ Antiemetic Adjunct/ Preop Sedation  meTOProlol tartrate tablet, ( Lopressor)  50 mg Oral q12h  - Indication: Antihypertensive/ Angina  Instructions:  first dose stat please  QUEtiapine tablet,  ( SEROquel)  25 mg Oral qam  - Indication: Management of signs/symptoms of psychotic disorders/Anxiety  Instructions:  in addition to pm dose  Allergies:  fosfomycin: Rash  Vital Signs: **Vital Signs.:   03-Dec-14 15:22  Vital Signs Type Q 4hr  Temperature Temperature (F) 98.4  Celsius 36.8  Temperature Source oral  Pulse Pulse 63  Respirations Respirations 18  Systolic BP Systolic BP 811  Diastolic BP (mmHg) Diastolic BP (mmHg) 71  Mean BP 107  Pulse Ox %  Pulse Ox % 97  Pulse Ox Activity Level  At rest  Oxygen Delivery Room Air/ 21 %   EXAM: GENERAL: Pleasant and interactive.  NAD.  Normocephalic and atraumatic.  EYES: Funduscopic exam shows normal disc size, appearance and C/D ratio without clear evidence of papilledema.  CARDIOVASCULAR: S1 and S2 sounds are within normal limits, without murmurs, gallops, or rubs.  MUSCULOSKELETAL: Tremor - None noted. Bulk - Normal Tone - Normal Pronator Drift - LUE drift noted. Ambulation - Gait and station testing is deferred due to falls risk, unable to walk with PT earlier in day per son. Apraxia - mild LUE apraxia noted.  R/L 5/4    Shoulder abduction (deltoid/supraspinatus, axillary/suprascapular n, C5) 5/4    Elbow flexion (biceps brachii, musculoskeletal n, C5-6) 5/4    Elbow extension (triceps, radial n, C7) 5/4    Finger adduction (interossei, ulnar n, T1)   5/4    Hip flexion (iliopsoas, L1/L2) 5/4    Knee flexion (hamstrings, sciatic n, L5/S1) 5/4  Knee extension (quadriceps, femoral n, L3/4) 5/4    Ankle dorsiflexion (tibialis anterior, deep fibular n, L4/5) 5/4    Ankle plantarflexion (gastroc, tibial n, S1)  NEUROLOGICAL: MENTAL STATUS: Patient is oriented to person, place and time.  Recent and remote memory are intact.  Attention span and concentration are moderately diminished.  Naming and repetition are intact.  Comprehension and expressive speech are mildly impaired.  Patient's fund of knowledge is within normal limits for educational level.  CRANIAL NERVES: Normal    CN II (normal visual acuity and visual fields) Normal    CN III, IV, VI (extraocular muscles are intact) Normal    CN V (facial sensation is intact bilaterally) Normal    CN VII (facial strength is intact bilaterally) Normal    CN VIII (hearing is intact bilaterally) Normal    CN IX/X (palate elevates midline, normal phonation) Normal    CN XI (shoulder shrug strength is normal and symmetric) Normal     CN XII (tongue protrudes midline)   SENSATION: Intact to pain and temp bilaterally (spinothalamic tracts) Intact to position and vibration bilaterally (dorsal columns)   REFLEXES: R/L 2+/2+    Biceps 2+/2+    Brachioradialis   2+/2+    Patellar 2+/2+    Achilles   COORDINATION/CEREBELLAR: Finger to nose testing is within normal limits..  Lab Results:  Hepatic:  30-Nov-14 17:27   Bilirubin, Total 0.4  Alkaline Phosphatase 68 (45-117 NOTE: New Reference Range 07/13/13)  SGPT (ALT) 12  SGOT (AST) 23  Total Protein, Serum 6.4  Albumin, Serum  3.2  Cardiology:  30-Nov-14 17:36   Ventricular Rate 105  Atrial Rate 105  P-R Interval 178  QRS Duration 82  QT 348  QTc 459  P Axis 70  R Axis 67  T Axis 2  ECG interpretation Electronic atrial pacemaker Nonspecific ST abnormality Abnormal ECG When compared with ECG of 29-Mar-2013 14:06, Electronic atrial pacemaker has replaced Sinus rhythm ST no longer elevated in Inferior leads Nonspecific T wave abnormality no longer evident in Anterior leads ----------unconfirmed---------- Confirmed by OVERREAD, NOT ([id number]), editor PEARSON, BARBARA ([id number]) on 07/24/2013 1:27:48 PM  Routine Chem:  30-Nov-14 17:27   Glucose, Serum  131  BUN 16  Creatinine (comp) 1.06  Sodium, Serum 140  Potassium, Serum 4.0  Chloride, Serum  108  CO2, Serum 24  Calcium (Total), Serum 8.8  Osmolality (calc) 282  eGFR (African American)  56  eGFR (Non-African American)  49 (eGFR values <82mL/min/1.73 m2 may be an indication of chronic kidney disease (CKD). Calculated eGFR is useful in patients with stable renal function. The eGFR calculation will not be reliable in acutely ill patients when serum creatinine is changing rapidly. It is not useful in  patients on dialysis. The eGFR calculation may not be applicable to patients at the low and high extremes of body sizes, pregnant women, and vegetarians.)  Anion Gap 8  01-Dec-14 05:51   Cholesterol, Serum  125  Triglycerides, Serum 105  HDL (INHOUSE) 43  VLDL Cholesterol Calculated 21  LDL Cholesterol Calculated 61 (Result(s) reported on 23 Jul 2013 at 06:27AM.)  Cardiac:  30-Nov-14 17:27   Troponin I < 0.02 (0.00-0.05 0.05 ng/mL or less: NEGATIVE  Repeat testing in 3-6 hrs  if clinically indicated. >0.05 ng/mL: POTENTIAL  MYOCARDIAL INJURY. Repeat  testing in 3-6 hrs if  clinically indicated. NOTE: An increase or decrease  of 30% or more on serial  testing suggests a  clinically important change)  Routine UA:  30-Nov-14 17:50   Color (UA) Yellow  Clarity (UA) Clear  Glucose (UA) Negative  Bilirubin (UA) Negative  Ketones (UA) Negative  Specific Gravity (UA) 1.025  Blood (UA) Negative  pH (UA) 6.0  Protein (UA) 30 mg/dL  Nitrite (UA) Negative  Leukocyte Esterase (UA) Negative (Result(s) reported on 22 Jul 2013 at 06:12PM.)  RBC (UA) 11 /HPF  WBC (UA) 5 /HPF  Bacteria (UA) 3+  Epithelial Cells (UA) 2 /HPF  Mucous (UA) PRESENT (Result(s) reported on 22 Jul 2013 at 06:12PM.)  Routine Coag:  30-Nov-14 17:27   Activated PTT (APTT) 33.8 (A HCT value >55% may artifactually increase the APTT. In one study, the increase was an average of 19%. Reference: "Effect on Routine and Special Coagulation Testing Values of Citrate Anticoagulant Adjustment in Patients with High HCT Values." American Journal of Clinical Pathology 2006;126:400-405.)  03-Dec-14 06:32   Prothrombin  18.8  INR 1.6 (INR reference interval applies to patients on anticoagulant therapy. A single INR therapeutic range for coumarins is not optimal for all indications; however, the suggested range for most indications is 2.0 - 3.0. Exceptions to the INR Reference Range may include: Prosthetic heart valves, acute myocardial infarction, prevention of myocardial infarction, and combinations of aspirin and anticoagulant. The need for a higher or lower target INR must be assessed individually. Reference: The  Pharmacology and Management of the Vitamin K  antagonists: the seventh ACCP Conference on Antithrombotic and Thrombolytic Therapy. ZTIWP.8099 Sept:126 (3suppl): [id number]. A HCT value >55% may artifactually increase the PT.  In one study,  the increase was an average of 25%. Reference:  "Effect on Routine and Special Coagulation Testing Values of Citrate Anticoagulant Adjustment in Patients with High HCT Values." American Journal of Clinical Pathology 2006;126:400-405.)  Routine Hem:  30-Nov-14 17:27   WBC (CBC) 6.1  RBC (CBC) 4.26  Hemoglobin (CBC) 12.7  Hematocrit (CBC) 38.2  Platelet Count (CBC) 189  MCV 90  MCH 29.9  MCHC 33.3  RDW 13.5  Neutrophil % 66.1  Lymphocyte % 24.8  Monocyte % 6.1  Eosinophil % 2.1  Basophil % 0.9  Neutrophil # 4.0  Lymphocyte # 1.5  Monocyte # 0.4  Eosinophil # 0.1  Basophil # 0.1 (Result(s) reported on 22 Jul 2013 at 05:51PM.)   Radiology Results: CT:    30-Nov-14 17:38, CT Head Without Contrast  CT Head Without Contrast   REASON FOR EXAM:    stroke-like symptoms  COMMENTS:       PROCEDURE: CT  - CT HEAD WITHOUT CONTRAST  - Jul 22 2013  5:38PM     CLINICAL DATA:  Fall at [employer]. Left-sided contraction. Facial  drooping. History of CVA.    EXAM:  CT HEAD WITHOUT CONTRAST    TECHNIQUE:  Contiguous axial images were obtained from the base of the skull  through the vertex without intravenous contrast.  COMPARISON:  03/29/2013    FINDINGS:  Sinuses/Soft tissues: No significant soft tissue swelling.  Hypoplastic frontal sinuses. No skull fracture. Clear paranasal  sinuses and mastoid air cells.    Intracranial: Mild cerebral atrophy. Mild low density in the  periventricular white matter likely related to small vessel disease.  Large remote right MCA infarct. A left frontal lobe remote infarct.  No hemorrhage, hydrocephalus, intra-axial, or extra-axial fluid  collection. No convincing evidence of acute infarct.  Subtle  hyperattenuation in the left MCA on image 11/series 2.     IMPRESSION:  1. Remote right MCA and left frontal infarcts.  2. No convincing  evidence of acute superimposed process.  3. Subtle hyperattenuation in the left MCA. Nonspecific in this age  group. Difficult to exclude slow flow or thrombus in this area.  Correlate with right-sided symptoms.  These results were called by telephone at the time of interpretation  on 07/22/2013 at 6:00 PM to Dr. Lavonia Drafts , who verbally  acknowledged these results.      Electronically Signed    By: Abigail Miyamoto M.D.    On: 07/22/2013 18:02       Verified By: Areta Haber, M.D.,    01-Dec-14 17:38, CT Head Without Contrast  CT Head Without Contrast   REASON FOR EXAM:    left hemiparesis, stroke vs seizure with Todd's   paralysis  COMMENTS:       PROCEDURE: CT  - CT HEAD WITHOUT CONTRAST  - Jul 23 2013  5:38PM     CLINICAL DATA:  Left hemi paresis. History of cerebral vascular  accident    EXAM:  CT HEAD WITHOUT CONTRAST    TECHNIQUE:  Contiguous axial images were obtained from the base of the skull  through the vertex without intravenous contrast.  COMPARISON:  CT 07/22/2013    FINDINGS:  Cortical infarction in the territory of the right middle cerebral  artery is not changed from prior exams. . Additional high and left  frontal infarction is also unchanged. No acute intracranial  hemorrhage. No evidence of interval new infarction. No midline shift  or mass effect. No hydrocephalus.    Paranasal sinuses and mastoid air cells are clear. Calcified  meningioma along the left CP angle again noted.     IMPRESSION:  Remote MCA territory infarction and high left frontal infarction.  Non cm infarction.      Electronically Signed    By: Suzy Bouchard M.D.    On: 07/23/2013 17:52         Verified By: Rennis Golden, M.D.,   Impression/Recommendations: Recommendations:   81 year old woman with afib presentswith  left upper and lower extremity weakness, left facial droop and dysarthria.   Old right MCA and left frontal infarcts.  Images personally viewed on both HCTs.  Likely cardioembolic given her history of afib.  Rec continuing on coumadin to minimize risk of having another ischemic cardioembolic stroke.  Patient is doing better functionally.  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  Continue on Keppra 500 mg bid.  Personally interpreted her routine EEG which is negative for epileptiform activities.  Please followup with me in outpatient setting to follow this potential seizure disorder closely.  Given weakness still, will need to work with PT and OT.  In addition, subradiographic acute stroke is a possibility.  Given prior stroke, would recommend carotid ultrasound to eval for carotid artery stenosis, though once again the prior strokes much more likely to have been cardioembolic.  Discussed the ddx with her and son today.   have reviewed the results of the most recent imaging studies, tests and labs as outlined above and answered all related questions.  and coordinated plan of care with hospitalist.   Electronic Signatures: Anabel Bene (MD)  (Signed 04-Dec-14 00:55)  Authored: REFERRING PHYSICIAN, Primary Care Physician, Consult, History of Present Illness, Review of Systems, PAST MEDICAL/SURGICAL HISTORY, HOME MEDICATIONS, Current Medications, ALLERGIES, NURSING VITAL SIGNS, Physical Exam-, LAB RESULTS, RADIOLOGY RESULTS, Recommendations   Last Updated: 04-Dec-14 00:55 by Melrose Nakayama,  Doneta Public (MD)

## 2014-12-13 NOTE — H&P (Signed)
PATIENT NAME:  Sheri Hayden, Sheri Hayden MR#:  [id number] DATE OF BIRTH:  1928/08/01  EMERGENCY DEPARTMENT REFERRING PHYSICIAN: Dr. Harvest Dark.  PRIMARY CARE PHYSICIAN: Dr. Ouida Sills.   CHIEF COMPLAINT: Left-sided facial droop and left arm and left leg weakness.   HISTORY OF PRESENT ILLNESS: The patient is an 81 year old white female with history of coronary artery disease status post CABG, has a history of paroxysmal Afib, who was recently hospitalized on June 17 after she had fallen and was admitted to [facility]. At that time, she was diagnosed with multiple strokes.   The patient had evaluation there including an EEG and was diagnosed with possible seizure and bilateral CVAs, who was sent to rehab for a few weeks and then brought back home by her daughter.   The patient also at the time when she had her stroke in June was complaining of back pain, so she was seen by orthopedics, Dr. Rudene Christians, who did a kyphoplasty on the 5th. The patient's Coumadin was held 7 days prior to the procedure and it was resumed after the surgery, and earlier today, the patient was at home when she all of a sudden started developing left-sided facial droop, and her upper extremity and lower extremity became limp according to the home health nurse. By the time, her daughter got there, these symptoms had improved.   The patient currently is doing better in terms of the weakness and the facial droop.   She also has had decrease in appetite and is not eating much according to the family.   The patient also has had a maculopapular rash which the family thinks may have started after she received a dose of nitrofurantoin, but it has also continued to be persistent. The patient also was  recently started on Dilantin.   The patient otherwise denies any chest pains, palpitations. Complains of generalized weakness. No abdominal pain. No nausea, vomiting, diarrhea. No urinary complaints.   PAST MEDICAL HISTORY:  1.  History of  coronary artery disease, status post CABG in 1997.  2.  History of paroxysmal Afib.  3.  Hypertension.  4.  Hyperlipidemia.  5.  Seizure disorder.  6.  Dementia.  7.  Status post pacemaker placement.  8.  History of CVA. Initial CVA was in 2013 and then another one in February 06, 2013.  9.  History of GERD.   PAST SURGICAL HISTORY: Status post appendectomy, cholecystectomy, hysterectomy, CABG.   ALLERGIES: NITROFURANTOIN AND LIKELY DILANTIN.   CURRENT MEDICATIONS: She is on carvedilol 12.5 mg one tab p.o. b.i.d., diltiazem 240 daily, lisinopril 5 daily, mirtazapine 7.5 mg at bedtime, Norco 325/5 mg one tab p.o. q.4 p.r.n. pain, omeprazole 20 daily, DILANTIN 300 AT BEDTIME, simvastatin 10 at bedtime, tramadol 50, one to 2 tabs q.4 p.r.n. Warfarin, 4 mg she took yesterday because that was a double dose. She is normally supposed to take only 2.   SOCIAL HISTORY: No history of tobacco abuse. Currently resides with her daughter. No alcohol use.   FAMILY HISTORY: Three brothers had bypass surgeries at age 79.   REVIEW OF SYSTEMS:  CONSTITUTIONAL: Denies any fevers. Complains of fatigue and weakness. No pain. No weight loss. No weight gain.  EYES: No blurred or double vision. Has a history of cataracts. No pain. No redness. No inflammation. No glaucoma.  ENT: No tinnitus. No ear pain. No hearing loss. No seasonal or year-round  (Dictation Anomaly) epistaxis.  RESPIRATORY: Denies any cough, wheezing, hemoptysis. No COPD. CARDIOVASCULAR: Denies any chest pain,  orthopnea, edema or arrhythmia.  GASTROINTESTINAL: No nausea, vomiting, diarrhea. No abdominal pain. No hematemesis. No melena.  GENITOURINARY: Denies any dysuria, hematuria, renal calc or frequency.  ENDOCRINE: Denies any polyuria, nocturia, or thyroid problems.  HEMATOLOGIC AND LYMPHATIC: Denies anemia, easy bruisability or bleeding.  SKIN: Has diffuse maculopapular rash, sparing her face.  MUSCULOSKELETAL: Has pain in her back that has  improved after surgery. No gout.  NEUROLOGIC: Has a history of CVA, possible seizure. May be dementia.  PSYCHIATRIC: No anxiety. No insomnia. No ADD.   PHYSICAL EXAMINATION:  VITAL SIGNS: Temperature 97.8, pulse 71, respirations 20, blood pressure 109/64.  GENERAL: The patient is a well-developed female currently not in any acute distress.  HEENT: Head atraumatic, normocephalic. Pupils equally round, reactive to light and accommodation. There is no conjunctival pallor. No scleral icterus. Nasal exam shows no drainage or ulceration.  OROPHARYNX: Clear without any exudates.  NECK: Supple without any JVD. No carotid bruits.  CARDIOVASCULAR: Regular rate and rhythm. No murmurs, rubs, clicks or gallops. PMI is not displaced.  LUNGS: Clear to auscultation bilaterally without any rales, rhonchi, or wheezing.  ABDOMEN: Soft, nontender, nondistended. Positive bowel sounds x4. No hepatosplenomegaly.  EXTREMITIES: Nonpitting edema in both lower extremities. Good DP, PT pulses.  SKIN: She has got diffuse maculopapular rash involving her arms, chest, legs, sparing her face.  LYMPHATICS: No lymph nodes palpable.  PSYCHIATRIC: Not anxious or depressed.  NEUROLOGIC: The patient has a slight facial droop on the left face; otherwise, cranial nerves II through XII grossly intact. Strength is diminished in the left lower extremity 4/5. The rest of the extremities is 5/5. Babinski is downgoing, 2+ reflexes.   EVALUATION IN THE EMERGENCY DEPARTMENT: EKG showed normal sinus rhythm without any ST-T-wave changes. BMP: Glucose was 145. BUN 22, creatinine 0.98, sodium 137, potassium 4.0, chloride 105. CO2 is 27. Calcium is 8.5. LFTs showed a total protein 5.5, albumin 2.2, bili total 0.4, alk phos 137. AST 24, ALT 23, CPK 46. CK-MB is less than 0.5. Troponin less than 0.02. INR 1.0. CT scan of the head shows low attenuation area in the bilateral frontal and right temporal parietal region suggestive of a late subacute  infarct. No hemorrhage appreciated.   ASSESSMENT AND PLAN: The patient is an 81 year old with paroxysmal atrial fibrillation, on chronic Coumadin, which had to be held for her kyphoplasty. Presents with an episode of left-sided facial droop, left-sided weakness, which has improved.  1.  Possible recurrent cerebrovascular accident/transient ischemic attack at this time. Likely cause is embolic. It could be related to the patient's Coumadin being held, or she could have (Dictation Anomaly) embolic cerebrovascular accidents. At this time, I am going to give her a higher dose of Coumadin tonight, follow her INRs. Will obtain records from the recent hospitalization from [facility] to see if they have done carotid Dopplers, echo, et Ronney Asters.  2.  Diffuse maculopapular rash, likely due to DILANTIN. The patient had a possible seizure last month, which is not clear. I will stopped Dilantin and will change her to [place].  3.  Loss of appetite. We will try Megace to see if that helps with her appetite.  4.  Hyperlipidemia. Continue simvastatin. Will check a fasting lipid panel.  5.  Gastroesophageal reflux disease. We will continue omeprazole.  6.  Miscellaneous. The patient already on Coumadin. That should be sufficient for deep vein thrombosis prophylaxis. Will also obtain PT evaluation and treat.   TIME SPENT: 45 minutes spent on this H and P.  The patient's service will be transferred to Dr. Ouida Sills, her primary care provider.     ____________________________ Lafonda Mosses. Posey Pronto, MD shp:np D: 03/29/2013 17:16:39 ET T: 03/29/2013 19:06:18 ET JOB#: [id number]  cc: Manford Sprong H. Posey Pronto, MD, <Dictator> Alric Seton MD ELECTRONICALLY SIGNED 04/02/2013 10:33

## 2014-12-13 NOTE — H&P (Signed)
PATIENT NAME:  Sheri Hayden, PITSTICK MR#:  [id number] DATE OF BIRTH:  [birth year]  DATE OF ADMISSION:  07/22/2013  CHIEF COMPLAINT: Weakness of the left upper and left lower extremity, slurred speech, mostly garbled speech, left facial droop, and tremor of the extremities. As the daughter can describe, the patient was seen last normal around 3:00 p.m. She was going into her family car without assistance for a short ride of about 30 minutes, and then whenever she was trying to get out of the car, she was really weak, mostly in her left upper extremity, and started having some significant slurred speech, described as garbled. The daughter noticed worsening of her facial droop, and there was some twitching of the left side of her face and left upper extremity. The symptoms improved, but the patient was brought to the Emergency Department. In the Emergency Department, she was evaluated by the ER physician and also the neurologist. The neurologist was concerned about a new stroke, needing tPA. The patient got a CT of the head, without any new findings and whenever she had a CT angio, the patient did not have any acute clots. I spoke a at length with Dr. Daryl Eastern, the neurologist who wants to admit her to the hospital, put her on seizure medication, 1500 mg of Keppra twice daily. Whenever she gets discharged, she can get discharged on 500 if there is no seizure activity and also if her CT scan tomorrow to check for stroke is negative, she will need to be fully anticoagulated with Coumadin. The patient is in good condition. I had a long conversation with the daughter, who is okay with her care, and she is going to be a DO NOT RESUSCITATE, as she is already on hospice at home. She lives with her daughter.   PAST MEDICAL HISTORY: 1.  Coronary artery disease, status post CABG in 1997.  2.  Paroxysmal atrial fibrillation.  3.  Hyperlipidemia.  4.  Hypertension.  5.  Seizure disorder.  6.  Dementia.  7.  Permanent pacemaker.   8.  Cerebrovascular accident in 2013, then in June 2014 and then recently another CVA. 9.  History of GERD.      ALLERGIES: NITROFURANTOIN AND DILANTIN.   PAST SURGICAL HISTORY: 1.  Appendectomy. 2.  Cholecystectomy. 3.  CABG in 1997.  4.  Hysterectomy.    CURRENT MEDICATIONS: Checked with the daughter: Metoprolol 25 mg once a day, Keppra 500 mg twice daily (her last dose was supposed to be today), prednisone 10 mg once a day for appetite stimulation, Seroquel 50 mg once a day at night, warfarin 4 mg once a day. The patient used to be on mirtazapine, lisinopril, diltiazem; all of those medications have been stopped.   SOCIAL HISTORY: Never smoked, never drank. She lives with her daughter. She has dementia. Now the patient has home hospice.    FAMILY HISTORY: Coronary artery disease in three brothers.   REVIEW OF SYSTEMS: A 12 system review unable to obtain due to the patient's dementia.   PHYSICAL EXAMINATION: VITAL SIGNS: Blood pressure 156/69, pulse 65, respirations 18, temperature 97.7.  GENERAL: The patient is alert, demented, disoriented. Speech is garbled. HEENT: Pupils are equal and reactive. Extraocular movements are intact. Mucosa are moist. Anicteric sclerae. Pink conjunctivae. No oral lesions. No oropharyngeal exudates.  NEUROLOGIC: Speech, as mentioned above, is dysarthric. Cranial nerves intact. Tongue is central. Facial sensation intact. Strength 1 to 2/5 on the left upper extremity; left lower extremity is 4+. No deficits on  the right side. Sensation is normal and intact in all four extremities. The patient withdraws from pain. Babinski is intact.  CARDIOVASCULAR: Regular rate and rhythm. No murmurs, rubs, or gallops. Patient has history of paroxysmal atrial fib, but right now is in normal sinus rhythm. No displacement of PMI.  LUNGS: Clear, without any wheezing or crepitus. No use of accessory muscles.  ABDOMEN: Soft, nontender, nondistended. No hepatosplenomegaly. No  masses. Bowel sounds are positive.  GENITAL: Negative for external lesions.  EXTREMITIES: No edema, cyanosis or clubbing. Pulses +2. Capillary refill less than 3.  PSYCHIATRIC: The patient has no agitation, but she is difficult to understand due to her significant dysarthria.  LYMPHATIC: Negative for lymphadenopathy in the neck or supraclavicular areas.  SKIN: No rashes or petechiae.  MUSCULOSKELETAL: No joint abnormalities or joint effusion.   LABORATORY, DIAGNOSTIC AND RADIOLOGICAL DATA: Glucose 131, creatinine 1.06, sodium 140, chloride 108. Total protein 6.4, albumin 3.2. White count 6.1, hemoglobin 12, platelet count 189, INR 1.3. White count 5, red blood count 11 on the urine. Negative nitrites and leukocyte esterase. CT scan of the head without contrast shows remote right MCA and left frontal infarcts. No evidence of acute superimposed process, some hyperattenuation of the left MCA, nonspecific at this age. The patient underwent a CT angio of the neck and head that showed atherosclerosis of the carotid arteries, right subclavian artery stenosis 65%, vertebral artery stenosis was not seen. Stable CT appearance of the brain with right greater than left MCA chronic infarcts. There is a meningioma without cerebral edema. EKG: Normal sinus rhythm.   ASSESSMENT AND PLAN:  1.  This is an 81 year old female with a recent previous stroke. Came in with what appears to be seizure activity. As per Dr. Daryl Eastern, this could be a focal seizure, for which he the patient to be Keppra. He recommended 1500 twice daily for now, and then when she gets discharged, discharge her on Keppra 500 mg twice daily.  2.  As far as evaluation of seizures, she is going to need an EEG in the morning. We are going to add on Ativan p.r.n. for seizures, seizure precautions.  3.  As far as possible cerebrovascular accident, the patient does not have any acute findings on the CT scan, but there is still a possibility to have CVA in  development, for which Dr. Daryl Eastern recommended a CT scan of the head in the morning. Repeat to evaluate again since the patient cannot have an MRI due to permanent pacemaker.  4.  As far as her possible cerebrovascular accident, we will continue aspirin. The patient is on Coumadin. We are going to hold Coumadin for now, until there is a significant answer about the possibility of cerebrovascular accident versus none. The patient is going to need to be anticoagulated fully with Coumadin due to her history of atrial fibrillation and three strokes, if her CT scan is negative. Other than that, the patient is hemodynamically stable. She is going to be admitted for neuro checks. She is going to be n.p.o. due to her dysarthria. Speech evaluation and PT evaluation are needed. The patient's plan is to go back home as she is already on hospice.  5.  As far as hypertension, hold metoprolol due to possible stroke for permissive hypertension. 6.  Hyperlipidemia. Continue statin.  7.  Dementia seems to be stable. Continue Seroquel. 8.  History of atrial fibrillation. Monitor closely.  9.  Gastroesophageal reflux disease. Continue proton pump inhibitor, and as far as deep  vein thrombosis prophylaxis, we are going to do heparin. If tomorrow she is on starting on Coumadin, we are going to stop heparin.   TIME SPENT: I spent about 60 minutes with this patient.   The patient is patient of Einar CrowMarshall Anderson. We are going to sign of care to him.    ____________________________ Felipa Furnaceoberto Sanchez Gutierrez, MD rsg:cg D: 07/22/2013 23:26:33 ET T: 07/23/2013 00:00:03 ET JOB#: [id number]  cc: Felipa Furnaceoberto Sanchez Gutierrez, MD, <Dictator> Twania Bujak Juanda ChanceSANCHEZ GUTIERRE MD ELECTRONICALLY SIGNED 08/02/2013 18:08

## 2014-12-13 NOTE — Op Note (Signed)
PATIENT NAME:  Sheri Hayden, Sheri Hayden MR#:  [id number] DATE OF BIRTH:  12-31-1927  DATE OF PROCEDURE:  03/27/2013  PREOPERATIVE DIAGNOSIS: L1 compression fracture.   POSTOPERATIVE DIAGNOSIS: L1 compression fracture.  PROCEDURE: L1 biopsy and kyphoplasty.   ANESTHESIA: MAC.   SURGEON: Leitha SchullerMichael J. Jacorey Donaway, MD.   DESCRIPTION OF PROCEDURE: The patient was brought to the operating room and after adequate anesthesia was obtained, the patient was placed prone. C-arm was brought in and both AP and lateral projections and very good visualization of the compressed L1 vertebral body were made. Timeout procedure was carried out and a local anesthetic of 5 mL on each side was placed. The back was then prepped and draped in the usual sterile fashion. After the patient had a repeat timeout procedure with patient identification, a spinal needle was placed down to the pedicle and a combination of Sensorcaine with epinephrine and Xylocaine was infiltrated along the tract. A stab incision was then made on the right side and the trocar advanced into the vertebral body through a perpendicular approach. Biopsy was obtained. Bone was quite soft. The left side was then approached in a similar fashion and drilling was carried out on both sides under direct imaging. Balloon was placed on both sides and these were both inflated to approximately 4 mL, which gave partial correction of a very flattened vertebral body. Following this, the cement was mixed and inserted with approximately 7.5 mL total placed that gave good interdigitation of the bone. After the bone was adequately set, the trocars were removed and permanent C-arm views were obtained. The wounds were closed with Dermabond and Band-Aids. The patient was sent to the recovery room in stable condition.   ESTIMATED BLOOD LOSS: Minimal.   COMPLICATIONS: None.   SPECIMEN: L1 vertebral body biopsy.   ESTIMATED BLOOD LOSS: Minimal.   ____________________________ Leitha SchullerMichael J. Dima Ferrufino,  MD mjm:aw D: 03/27/2013 09:40:18 ET T: 03/27/2013 09:51:47 ET JOB#: [id number]  cc: Leitha SchullerMichael J. Dnasia Gauna, MD, <Dictator> Leitha SchullerMICHAEL J Masud Holub MD ELECTRONICALLY SIGNED 03/27/2013 11:40

## 2014-12-14 NOTE — Consult Note (Signed)
PATIENT NAME:  Sheri Hayden, Sheri Hayden MR#:  [id number] DATE OF BIRTH:  December 01, 1927  DATE OF CONSULTATION:  07/22/2013  REFERRING PHYSICIAN:   CONSULTING PHYSICIAN:  Leotis Pain, MD  REASON FOR CONSULTATION: Stroke versus questionable seizure activity. All information is obtained from the patient's family members, who are at the bedside.   HISTORY OF PRESENT ILLNESS:  This is an 81 year old female with past medical history of anxiety, history of stroke in July of 2014 described as an ischemic stroke where the patient had weakness in the left upper and left lower extremities with minimal residual deficits. The patient has also paroxysmal atrial fibrillation and she has a pacemaker. The patient presented with suspected questionable seizure activity versus stroke. As per the patient's daughter, who was at bedside, she states that they were in the car, last seen normal around 3:00 p.m. today. At 3:00 p.m., she was placed in the car without assistance. At 3:30, when she was ready to get out of the car, family noticed that she was weak in the left upper extremity, had garbled speech. They were not sure but felt there was a left facial droop and there were apparent seeing twitching in the left side of the face and the left upper extremity. At 4:15, the patient's symptoms improved. She was close to baseline and then had another episode of similar symptoms with left upper extremity twitching, left facial twitching and left upper and left lower extremity weakness, at which time she presented to the hospital. The patient is status post Ativan 3 mg in total. Her twitching has resolved and her speech is improving as per family. The patient does have a questionable history of seizure, was on Keppra 500 mg, last dose given this a.m. and the seizure history was around the time of the stroke in July of 2014 but the family does not recall any further seizure activity.   IMAGING: CAT scan of the head was reviewed. She has an old  right MCA and small left frontal infarcts. The patient is currently on Coumadin with subtherapeutic INR of 1.3.   LABORATORY EVALUATION: The patient's glucose is 131, BUN 16, creatinine 1.06, alk phos 68, ALT is 12, AST is 23. INR as described above which is 1.3. The patient's white blood cell count is 6.1, RBCs 4.26, hemoglobin is 12.7, hematocrit 38.2, platelets189.   NEUROLOGICAL EVALUATION:  The patient has garbled speech, told me she was at a regional hospital, had difficulty telling me the date and time. Extraocular movements appeared to be suppressed towards the left side. The patient has difficulty looking to the left but it is much improved from initial evaluation. She has difficulty bringing her eyes only to the left. No visual field cuts. No signs of neglect. Facial sensation intact. Facial motor appears to be intact. Tongue appears to be midline. Motor strength: The patient is very weak in the left upper extremity, 1 to 2 out of 5; left lower extremity is 4+ out of 5 and no deficits on the right upper and right lower extremity. Sensation appears to be intact. The patient withdraws from painful stimuli throughout. Coordination intact on the right side but could not be assessed on the left side due to weakness. Gait could not be assessed.   IMPRESSION: An 81 year old female noticed to be weaker on the left upper and left lower extremity, last seen normal at 3:00 p.m. At 3:30 p.m., noticed by family to have twitching of the left side of the face and left upper  extremity and to be dysarthric. Imaging as above, chronic stroke right middle cerebral artery and left frontal, questionable hyperattenuation on the left middle cerebral artery but does not contribute to the current symptoms.  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    PLAN: Increase Keppra, patient is going to be loaded with 1 gram and increased to 1 gram b.i.d. the patient is going for stat CTA of head and neck to see if there is any cutoff sign. If there is a cutoff sign, will consider transfer to an outside facility such as [facility] for clot retrieval and intervention. At this point, hold Coumadin for possibility of intervention. If there are no signs of stroke, the patient coming back to baseline, could restart Coumadin in a.m. Would repeat CAT scan of the head in the morning. The patient has a pacemaker and will not be able to obtain MRI.   Thank you, it was a pleasure seeing this patient. Please call with any questions.    ____________________________ Leotis Pain, MD yz:cs D: 07/22/2013 19:48:35 ET T: 07/22/2013 20:07:50 ET JOB#: [id number]  cc: Leotis Pain, MD, <Dictator> Leotis Pain MD ELECTRONICALLY SIGNED 08/24/2013 14:49

## 2015-04-27 ENCOUNTER — Other Ambulatory Visit
Admission: RE | Admit: 2015-04-27 | Discharge: 2015-04-27 | Disposition: A | Discharge status: Home / Self Care | Payer: Medicare Other | Source: Ambulatory Visit | Attending: Internal Medicine | Admitting: Internal Medicine

## 2015-04-27 LAB — URINALYSIS COMPLETE WITH MICROSCOPIC (ARMC ONLY)
BILIRUBIN URINE: NEGATIVE
Glucose, UA: NEGATIVE mg/dL
KETONES UR: NEGATIVE mg/dL
Nitrite: NEGATIVE
PROTEIN: NEGATIVE mg/dL
Specific Gravity, Urine: 1.013 (ref 1.005–1.030)
pH: 6 (ref 5.0–8.0)

## 2015-04-29 LAB — URINE CULTURE: Culture: 50000

## 2015-08-24 DEATH — deceased
